# Patient Record
Sex: Male | Born: 1970 | Race: Black or African American | Hispanic: No | Marital: Single | Smoking: Never smoker
Health system: Southern US, Community
[De-identification: ages and names within clinical notes are randomized; demographics above are authoritative.]

---

## 2002-09-14 ENCOUNTER — Encounter: Payer: Self-pay | Admitting: Emergency Medicine

## 2002-09-14 ENCOUNTER — Emergency Department (HOSPITAL_COMMUNITY): Admission: EM | Admit: 2002-09-14 | Discharge: 2002-09-14 | Payer: Self-pay | Admitting: Emergency Medicine

## 2005-02-21 ENCOUNTER — Emergency Department (HOSPITAL_COMMUNITY): Admission: EM | Admit: 2005-02-21 | Discharge: 2005-02-21 | Payer: Self-pay | Admitting: Emergency Medicine

## 2006-05-11 ENCOUNTER — Emergency Department (HOSPITAL_COMMUNITY): Admission: EM | Admit: 2006-05-11 | Discharge: 2006-05-11 | Payer: Self-pay | Admitting: Emergency Medicine

## 2006-06-28 ENCOUNTER — Inpatient Hospital Stay (HOSPITAL_COMMUNITY): Admission: EM | Admit: 2006-06-28 | Discharge: 2006-07-05 | Payer: Self-pay | Admitting: Emergency Medicine

## 2006-07-02 ENCOUNTER — Ambulatory Visit: Payer: Self-pay | Admitting: Internal Medicine

## 2006-07-03 ENCOUNTER — Ambulatory Visit: Payer: Self-pay | Admitting: Cardiology

## 2006-07-09 ENCOUNTER — Ambulatory Visit (HOSPITAL_COMMUNITY): Admission: RE | Admit: 2006-07-09 | Discharge: 2006-07-10 | Payer: Self-pay | Admitting: Orthopedic Surgery

## 2006-07-30 ENCOUNTER — Encounter: Admission: RE | Admit: 2006-07-30 | Discharge: 2006-07-30 | Payer: Self-pay | Admitting: Thoracic Surgery

## 2010-11-26 ENCOUNTER — Encounter: Payer: Self-pay | Admitting: Thoracic Surgery

## 2011-07-06 ENCOUNTER — Emergency Department (HOSPITAL_COMMUNITY)
Admission: EM | Admit: 2011-07-06 | Discharge: 2011-07-07 | Disposition: A | Discharge status: Home / Self Care | Payer: No Typology Code available for payment source | Attending: Emergency Medicine | Admitting: Emergency Medicine

## 2011-07-06 ENCOUNTER — Emergency Department (HOSPITAL_COMMUNITY): Payer: Self-pay

## 2011-07-06 DIAGNOSIS — M542 Cervicalgia: Secondary | ICD-10-CM | POA: Insufficient documentation

## 2011-07-06 DIAGNOSIS — R0789 Other chest pain: Secondary | ICD-10-CM | POA: Insufficient documentation

## 2011-07-06 DIAGNOSIS — S139XXA Sprain of joints and ligaments of unspecified parts of neck, initial encounter: Secondary | ICD-10-CM | POA: Insufficient documentation

## 2011-07-06 DIAGNOSIS — M549 Dorsalgia, unspecified: Secondary | ICD-10-CM | POA: Insufficient documentation

## 2015-01-02 ENCOUNTER — Encounter (HOSPITAL_COMMUNITY): Payer: Self-pay | Admitting: Emergency Medicine

## 2015-01-02 ENCOUNTER — Emergency Department (INDEPENDENT_AMBULATORY_CARE_PROVIDER_SITE_OTHER): Payer: Self-pay

## 2015-01-02 ENCOUNTER — Emergency Department (INDEPENDENT_AMBULATORY_CARE_PROVIDER_SITE_OTHER)
Admission: EM | Admit: 2015-01-02 | Discharge: 2015-01-02 | Disposition: A | Discharge status: Home / Self Care | Payer: Self-pay | Source: Home / Self Care | Attending: Emergency Medicine | Admitting: Emergency Medicine

## 2015-01-02 DIAGNOSIS — S134XXA Sprain of ligaments of cervical spine, initial encounter: Secondary | ICD-10-CM

## 2015-01-02 DIAGNOSIS — S239XXA Sprain of unspecified parts of thorax, initial encounter: Secondary | ICD-10-CM

## 2015-01-02 DIAGNOSIS — M545 Low back pain, unspecified: Secondary | ICD-10-CM

## 2015-01-02 MED ORDER — CYCLOBENZAPRINE HCL 5 MG PO TABS: 10.0000 mg | ORAL_TABLET | Freq: Two times a day (BID) | ORAL | Status: DC | PRN

## 2015-01-02 MED ORDER — TRAMADOL HCL 50 MG PO TABS: 50.0000 mg | ORAL_TABLET | Freq: Four times a day (QID) | ORAL | Status: DC | PRN

## 2015-01-02 MED ORDER — NAPROXEN 500 MG PO TABS: 500.0000 mg | ORAL_TABLET | Freq: Two times a day (BID) | ORAL | Status: AC
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
## 2015-01-02 NOTE — Discharge Instructions (Signed)
Cervical Sprain °A cervical sprain is an injury in the neck in which the strong, fibrous tissues (ligaments) that connect your neck bones stretch or tear. Cervical sprains can range from mild to severe. Severe cervical sprains can cause the neck vertebrae to be unstable. This can lead to damage of the spinal cord and can result in serious nervous system problems. The amount of time it takes for a cervical sprain to get better depends on the cause and extent of the injury. Most cervical sprains heal in 1 to 3 weeks. °CAUSES  °Severe cervical sprains may be caused by:  °· Contact sport injuries (such as from football, rugby, wrestling, hockey, auto racing, gymnastics, diving, martial arts, or boxing).   °· Motor vehicle collisions.   °· Whiplash injuries. This is an injury from a sudden forward and backward whipping movement of the head and neck.  °· Falls.   °Mild cervical sprains may be caused by:  °· Being in an awkward position, such as while cradling a telephone between your ear and shoulder.   °· Sitting in a chair that does not offer proper support.   °· Working at a poorly designed computer station.   °· Looking up or down for long periods of time.   °SYMPTOMS  °· Pain, soreness, stiffness, or a burning sensation in the front, back, or sides of the neck. This discomfort may develop immediately after the injury or slowly, 24 hours or more after the injury.   °· Pain or tenderness directly in the middle of the back of the neck.   °· Shoulder or upper back pain.   °· Limited ability to move the neck.   °· Headache.   °· Dizziness.   °· Weakness, numbness, or tingling in the hands or arms.   °· Muscle spasms.   °· Difficulty swallowing or chewing.   °· Tenderness and swelling of the neck.   °DIAGNOSIS  °Most of the time your health care provider can diagnose a cervical sprain by taking your history and doing a physical exam. Your health care provider will ask about previous neck injuries and any known neck  problems, such as arthritis in the neck. X-rays may be taken to find out if there are any other problems, such as with the bones of the neck. Other tests, such as a CT scan or MRI, may also be needed.  °TREATMENT  °Treatment depends on the severity of the cervical sprain. Mild sprains can be treated with rest, keeping the neck in place (immobilization), and pain medicines. Severe cervical sprains are immediately immobilized. Further treatment is done to help with pain, muscle spasms, and other symptoms and may include: °· Medicines, such as pain relievers, numbing medicines, or muscle relaxants.   °· Physical therapy. This may involve stretching exercises, strengthening exercises, and posture training. Exercises and improved posture can help stabilize the neck, strengthen muscles, and help stop symptoms from returning.   °HOME CARE INSTRUCTIONS  °· Put ice on the injured area.   °¨ Put ice in a plastic bag.   °¨ Place a towel between your skin and the bag.   °¨ Leave the ice on for 15-20 minutes, 3-4 times a day.   °· If your injury was severe, you may have been given a cervical collar to wear. A cervical collar is a two-piece collar designed to keep your neck from moving while it heals. °¨ Do not remove the collar unless instructed by your health care provider. °¨ If you have long hair, keep it outside of the collar. °¨ Ask your health care provider before making any adjustments to your collar. Minor   adjustments may be required over time to improve comfort and reduce pressure on your chin or on the back of your head.  Ifyou are allowed to remove the collar for cleaning or bathing, follow your health care provider's instructions on how to do so safely.  Keep your collar clean by wiping it with mild soap and water and drying it completely. If the collar you have been given includes removable pads, remove them every 1-2 days and hand wash them with soap and water. Allow them to air dry. They should be completely  dry before you wear them in the collar.  If you are allowed to remove the collar for cleaning and bathing, wash and dry the skin of your neck. Check your skin for irritation or sores. If you see any, tell your health care provider.  Do not drive while wearing the collar.   Only take over-the-counter or prescription medicines for pain, discomfort, or fever as directed by your health care provider.   Keep all follow-up appointments as directed by your health care provider.   Keep all physical therapy appointments as directed by your health care provider.   Make any needed adjustments to your workstation to promote good posture.   Avoid positions and activities that make your symptoms worse.   Warm up and stretch before being active to help prevent problems.  SEEK MEDICAL CARE IF:   Your pain is not controlled with medicine.   You are unable to decrease your pain medicine over time as planned.   Your activity level is not improving as expected.  SEEK IMMEDIATE MEDICAL CARE IF:   You develop any bleeding.  You develop stomach upset.  You have signs of an allergic reaction to your medicine.   Your symptoms get worse.   You develop new, unexplained symptoms.   You have numbness, tingling, weakness, or paralysis in any part of your body.  MAKE SURE YOU:   Understand these instructions.  Will watch your condition.  Will get help right away if you are not doing well or get worse. Document Released: 08/19/2007 Document Revised: 10/27/2013 Document Reviewed: 04/29/2013 [facility] Patient Information 2015 [employer], [place]. This information is not intended to replace advice given to you by your health care provider. Make sure you discuss any questions you have with your health care provider.  Ligament Sprain Ligaments are tough, fibrous tissues that hold bones together at the joints. A sprain can occur when a ligament is stretched. This injury may take several weeks to  heal. HOME CARE INSTRUCTIONS   Rest the injured area for as long as directed by your caregiver. Then slowly start using the joint as directed by your caregiver and as the pain allows.  Keep the affected joint raised if possible to lessen swelling.  Apply ice for 15-20 minutes to the injured area every couple hours for the first half day, then 03-04 times per day for the first 48 hours. Put the ice in a plastic bag and place a towel between the bag of ice and your skin.  Wear any splinting, casting, or elastic bandage applications as instructed.  Only take over-the-counter or prescription medicines for pain, discomfort, or fever as directed by your caregiver. Do not use aspirin immediately after the injury unless instructed by your caregiver. Aspirin can cause increased bleeding and bruising of the tissues.  If you were given crutches, continue to use them as instructed and do not resume weight bearing on the affected extremity until instructed. SEEK  MEDICAL CARE IF:   Your bruising, swelling, or pain increases.  You have cold and numb fingers or toes if your arm or leg was injured. SEEK IMMEDIATE MEDICAL CARE IF:   Your toes are numb or blue if your leg was injured.  Your fingers are numb or blue if your arm was injured.  Your pain is not responding to medicines and continues to stay the same or gets worse. MAKE SURE YOU:   Understand these instructions.  Will watch your condition.  Will get help right away if you are not doing well or get worse. Document Released: 10/19/2000 Document Revised: 01/14/2012 Document Reviewed: 08/17/2008 [facility] Patient Information 2015 [place], [place]. This information is not intended to replace advice given to you by your health care provider. Make sure you discuss any questions you have with your health care provider.  Ligament Sprain Ligaments are tough, fibrous tissues that hold bones together at the joints. A sprain can occur when a ligament  is stretched. This injury may take several weeks to heal. HOME CARE INSTRUCTIONS   Rest the injured area for as long as directed by your caregiver. Then slowly start using the joint as directed by your caregiver and as the pain allows.  Keep the affected joint raised if possible to lessen swelling.  Apply ice for 15-20 minutes to the injured area every couple hours for the first half day, then 03-04 times per day for the first 48 hours. Put the ice in a plastic bag and place a towel between the bag of ice and your skin.  Wear any splinting, casting, or elastic bandage applications as instructed.  Only take over-the-counter or prescription medicines for pain, discomfort, or fever as directed by your caregiver. Do not use aspirin immediately after the injury unless instructed by your caregiver. Aspirin can cause increased bleeding and bruising of the tissues.  If you were given crutches, continue to use them as instructed and do not resume weight bearing on the affected extremity until instructed. SEEK MEDICAL CARE IF:   Your bruising, swelling, or pain increases.  You have cold and numb fingers or toes if your arm or leg was injured. SEEK IMMEDIATE MEDICAL CARE IF:   Your toes are numb or blue if your leg was injured.  Your fingers are numb or blue if your arm was injured.  Your pain is not responding to medicines and continues to stay the same or gets worse. MAKE SURE YOU:   Understand these instructions.  Will watch your condition.  Will get help right away if you are not doing well or get worse. Document Released: 10/19/2000 Document Revised: 01/14/2012 Document Reviewed: 08/17/2008 [facility] Patient Information 2015 [place], [place]. This information is not intended to replace advice given to you by your health care provider. Make sure you discuss any questions you have with your health care provider.    Ice to areas for the next 48 hours 20 minutes at a time. Then may use  heat. Take Naprosyn twice a day for inflammation. Flexeril for spasms then Tramadol as needed.
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
## 2021-05-05 ENCOUNTER — Other Ambulatory Visit: Payer: Self-pay

## 2021-05-05 ENCOUNTER — Encounter (HOSPITAL_COMMUNITY): Payer: Self-pay | Admitting: Emergency Medicine

## 2021-05-05 ENCOUNTER — Ambulatory Visit (HOSPITAL_COMMUNITY)
Admission: EM | Admit: 2021-05-05 | Discharge: 2021-05-05 | Disposition: A | Discharge status: Home / Self Care | Payer: Self-pay | Attending: Physician Assistant | Admitting: Physician Assistant

## 2021-05-05 DIAGNOSIS — R030 Elevated blood-pressure reading, without diagnosis of hypertension: Secondary | ICD-10-CM

## 2021-05-05 DIAGNOSIS — G44209 Tension-type headache, unspecified, not intractable: Secondary | ICD-10-CM

## 2021-05-05 DIAGNOSIS — R509 Fever, unspecified: Secondary | ICD-10-CM

## 2021-05-05 DIAGNOSIS — R52 Pain, unspecified: Secondary | ICD-10-CM

## 2021-05-05 DIAGNOSIS — W57XXXA Bitten or stung by nonvenomous insect and other nonvenomous arthropods, initial encounter: Secondary | ICD-10-CM

## 2021-05-05 DIAGNOSIS — S30860A Insect bite (nonvenomous) of lower back and pelvis, initial encounter: Secondary | ICD-10-CM

## 2021-05-05 MED ORDER — DOXYCYCLINE HYCLATE 100 MG PO CAPS: 100.0000 mg | ORAL_CAPSULE | Freq: Two times a day (BID) | ORAL | 0 refills | Status: DC

## 2021-05-05 NOTE — ED Provider Notes (Signed)
[facility]    CSN: [id number] Arrival date & time: 05/05/21  1314      History   Chief Complaint Chief Complaint  Patient presents with   Tick Removal   Insect Bite    Tick bite, found today    HPI Gary Alvarez is a 50 y.o. male.   Patient presents today with a several day history of widespread symptoms including body aches, subjective fever, fatigue, malaise.  He is particularly concerned as he removed a tick earlier today from his groin that was moderately engorged.  He denies any associated rash.  He has cleaned affected area with soap and water but does not put any topical medications.  He denies any recent antibiotic use.  He denies any history of tickborne illness.  He is having difficulty with daily activities as result of symptoms.  He is concerned that he may have developed Lyme disease as he looked up the symptoms following removal of tick and states he has many of these.   History reviewed. No pertinent past medical history.  There are no problems to display for this patient.   History reviewed. No pertinent surgical history.     Home Medications    Prior to Admission medications   Medication Sig Start Date End Date Taking? Authorizing Provider  doxycycline (VIBRAMYCIN) 100 MG capsule Take 1 capsule (100 mg total) by mouth 2 (two) times daily. 05/05/21  Yes Khamani Daniely, Noberto Retort, PA-C    Family History History reviewed. No pertinent family history.  Social History Social History   Tobacco Use   Smoking status: Never   Smokeless tobacco: Never  Substance Use Topics   Alcohol use: No   Drug use: No     Allergies   Patient has no known allergies.   Review of Systems Review of Systems  Constitutional:  Positive for fatigue and fever (subjective). Negative for activity change and appetite change.  Eyes:  Negative for photophobia and visual disturbance.  Respiratory:  Negative for cough and shortness of breath.   Cardiovascular:  Negative  for chest pain.  Gastrointestinal:  Negative for abdominal pain, diarrhea, nausea and vomiting.  Musculoskeletal:  Positive for arthralgias, back pain and myalgias.  Neurological:  Positive for headaches. Negative for dizziness, weakness and light-headedness.    Physical Exam Triage Vital Signs ED Triage Vitals  Enc Vitals Group     BP 05/05/21 1412 (!) 153/82     Pulse Rate 05/05/21 1411 83     Resp 05/05/21 1411 16     Temp 05/05/21 1411 98.7 F (37.1 C)     Temp Source 05/05/21 1411 Oral     SpO2 05/05/21 1411 97 %     Weight --      Height --      Head Circumference --      Peak Flow --      Pain Score 05/05/21 1408 0     Pain Loc --      Pain Edu? --      Excl. in GC? --    No data found.  Updated Vital Signs BP (!) 153/82   Pulse 83   Temp 98.7 F (37.1 C) (Oral)   Resp 16   SpO2 97%   Visual Acuity Right Eye Distance:   Left Eye Distance:   Bilateral Distance:    Right Eye Near:   Left Eye Near:    Bilateral Near:     Physical Exam Vitals reviewed.  Constitutional:  General: He is awake.     Appearance: Normal appearance. He is normal weight. He is not ill-appearing.     Comments: Very pleasant male appears stated age in no acute distress  HENT:     Head: Normocephalic and atraumatic.     Mouth/Throat:     Pharynx: No oropharyngeal exudate, posterior oropharyngeal erythema or uvula swelling.  Cardiovascular:     Rate and Rhythm: Normal rate and regular rhythm.     Heart sounds: Normal heart sounds, S1 normal and S2 normal. No murmur heard. Pulmonary:     Effort: Pulmonary effort is normal.     Breath sounds: Normal breath sounds. No stridor. No wheezing, rhonchi or rales.     Comments: Clear to auscultation bilaterally Abdominal:     Palpations: Abdomen is soft.     Tenderness: There is no abdominal tenderness.  Genitourinary:    Comments: Exam deferred Musculoskeletal:     Cervical back: Tenderness present. No bony tenderness.      Thoracic back: Tenderness present. No bony tenderness.     Lumbar back: Tenderness present. No bony tenderness.     Comments: Normal active range of motion at all major joints.  Strength 5/5 bilateral upper and lower extremities.  Neurological:     Mental Status: He is alert.  Psychiatric:        Behavior: Behavior is cooperative.     UC Treatments / Results  Labs (all labs ordered are listed, but only abnormal results are displayed) Labs Reviewed - No data to display  EKG   Radiology No results found.  Procedures Procedures (including critical care time)  Medications Ordered in UC Medications - No data to display  Initial Impression / Assessment and Plan / UC Course  I have reviewed the triage vital signs and the nursing notes.  Pertinent labs & imaging results that were available during my care of the patient were reviewed by me and considered in my medical decision making (see chart for details).      Discussed with patient that often take several days to develop symptoms following tick bite but given presentation with recent tick removal will empirically treat for Lyme disease and [facility] spotted fever.  He was treated with doxycycline 100 mg twice daily for 10 days with instruction to stay out of the sun while on this medication due to photosensitivity.  No indication for lab work given it has not been several weeks since tick removal.  Encourage patient to use over-the-counter analgesics for pain relief.  Discussed alarm symptoms that warrant emergent evaluation.  Strict return precautions given to which patient expressed understanding.  Recommend he follow-up with primary care or our clinic within 1 week.  Blood pressure sent to be slightly elevated today.  Patient denies any signs/symptoms of endorgan damage including headache, vision changes, peripheral edema.  He was encouraged to avoid salt, decongestants, caffeine.  Encouraged him to monitor his blood pressure at  home and if it stays above 140/90 he is to return for reevaluation.  Recommended he follow-up with either our clinic or PCP within 1 week for blood pressure recheck.  Discussed alarm symptoms that warrant emergent evaluation to which patient expressed understanding.  Final Clinical Impressions(s) / UC Diagnoses   Final diagnoses:  Tick bite of pelvic region, initial encounter  Subjective fever  Body aches  Tension-type headache, not intractable, unspecified chronicity pattern  Elevated blood pressure reading     Discharge Instructions      We  are going to start doxycycline 100 mg twice daily for 10 days to treat for tickborne illness.  Please stay out of the sun while on this medication as it can make you sensitive to the sun and cause you to have a bad sunburn.  You can use Tylenol and ibuprofen for pain relief.  If you have any worsening or persistent symptoms you need to be reevaluated.  Please follow-up with our clinic or your primary care provider within 1 week to ensure symptom improvement.  Your blood pressure is elevated.  Please avoid salt , decongestants, caffeine.  Monitor blood pressure at home.  If this is persistently above 140/90 you need to be reevaluated to consider medication.  If you develop any headache, chest pain, shortness of breath, leg swelling, headache, vision changes with high blood pressure need to go to emergency room.  Please follow-up with our clinic or PCP in 1 week to ensure this is normalized.     ED Prescriptions     Medication Sig Dispense Auth. Provider   doxycycline (VIBRAMYCIN) 100 MG capsule Take 1 capsule (100 mg total) by mouth 2 (two) times daily. 20 capsule Samanyu Tinnell, Noberto Retort, PA-C      PDMP not reviewed this encounter.   Jeani Hawking, PA-C 05/05/21 1452

## 2021-05-05 NOTE — ED Triage Notes (Signed)
PT found a tick on groin today. He has removed it.

## 2021-05-05 NOTE — Discharge Instructions (Addendum)
We are going to start doxycycline 100 mg twice daily for 10 days to treat for tickborne illness.  Please stay out of the sun while on this medication as it can make you sensitive to the sun and cause you to have a bad sunburn.  You can use Tylenol and ibuprofen for pain relief.  If you have any worsening or persistent symptoms you need to be reevaluated.  Please follow-up with our clinic or your primary care provider within 1 week to ensure symptom improvement.  Your blood pressure is elevated.  Please avoid salt , decongestants, caffeine.  Monitor blood pressure at home.  If this is persistently above 140/90 you need to be reevaluated to consider medication.  If you develop any headache, chest pain, shortness of breath, leg swelling, headache, vision changes with high blood pressure need to go to emergency room.  Please follow-up with our clinic or PCP in 1 week to ensure this is normalized.

## 2023-01-03 ENCOUNTER — Other Ambulatory Visit: Payer: Self-pay

## 2023-01-03 ENCOUNTER — Encounter (HOSPITAL_BASED_OUTPATIENT_CLINIC_OR_DEPARTMENT_OTHER): Payer: Self-pay

## 2023-01-03 ENCOUNTER — Emergency Department (HOSPITAL_BASED_OUTPATIENT_CLINIC_OR_DEPARTMENT_OTHER): Payer: Self-pay

## 2023-01-03 ENCOUNTER — Emergency Department (HOSPITAL_BASED_OUTPATIENT_CLINIC_OR_DEPARTMENT_OTHER)
Admission: EM | Admit: 2023-01-03 | Discharge: 2023-01-03 | Disposition: A | Discharge status: Home / Self Care | Payer: Self-pay | Attending: Emergency Medicine | Admitting: Emergency Medicine

## 2023-01-03 ENCOUNTER — Ambulatory Visit
Admission: EM | Admit: 2023-01-03 | Discharge: 2023-01-03 | Disposition: A | Discharge status: Other Acute Inpatient Hospital | Payer: Self-pay

## 2023-01-03 DIAGNOSIS — S0101XA Laceration without foreign body of scalp, initial encounter: Secondary | ICD-10-CM | POA: Insufficient documentation

## 2023-01-03 DIAGNOSIS — S0990XA Unspecified injury of head, initial encounter: Secondary | ICD-10-CM

## 2023-01-03 DIAGNOSIS — W228XXA Striking against or struck by other objects, initial encounter: Secondary | ICD-10-CM | POA: Insufficient documentation

## 2023-01-03 DIAGNOSIS — S0191XA Laceration without foreign body of unspecified part of head, initial encounter: Secondary | ICD-10-CM

## 2023-01-03 DIAGNOSIS — S0181XA Laceration without foreign body of other part of head, initial encounter: Secondary | ICD-10-CM | POA: Insufficient documentation

## 2023-01-03 MED ORDER — LIDOCAINE-EPINEPHRINE-TETRACAINE (LET) TOPICAL GEL
3.0000 mL | Freq: Once | TOPICAL | Status: DC
  Filled 2023-01-03: qty 3
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
## 2023-01-03 NOTE — ED Notes (Signed)
Reviewed AVS/discharge instruction with patient. Time allotted for and all questions answered. Patient is agreeable for d/c and escorted to ed exit by staff.  

## 2023-01-03 NOTE — ED Notes (Signed)
Patient is being discharged from the Urgent Care and sent to the Emergency Department via POV . Per Arthor Captain., NP, patient is in need of higher level of care due to head lacerations. Patient is aware and verbalizes understanding of plan of care.  Vitals:   01/03/23 1936  BP: (!) 150/93  Pulse: 100  Resp: 18  Temp: 97.9 F (36.6 C)  SpO2: 95%

## 2023-01-03 NOTE — Discharge Instructions (Signed)
Please go to the emergency department as soon as you leave urgent care for further evaluation and management. ?
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
## 2023-01-03 NOTE — Discharge Instructions (Addendum)
You have been seen in the Emergency Department (ED) today for a laceration (cut). Please keep the cut clean but do not submerge it in the water.  Have your staples removed in: 5-7 days    Please follow up with your doctor as needed regarding today's emergent visit. Take Tylenol and/or Ibuprofen as needed for pain.  Return to the ED or call your doctor if you notice any signs of infection such as fever >100.75F, increased pain, increased redness, pus, or other symptoms that concern you.   Scarring precautions:  Always keep your cut, scrape or other skin injury clean. Gently wash the area with mild soap and water to keep out germs and remove debris.  To help the injured skin heal, use petroleum jelly to keep the wound moist. Petroleum jelly prevents the wound from drying out and forming a scab; wounds with scabs take longer to heal. This will also help prevent a scar from getting too large, deep or itchy. As long as the wound is cleaned daily, it is not necessary to use anti-bacterial ointments.  After cleaning the wound and applying petroleum jelly or a similar ointment, cover the skin with an adhesive bandage. For large scrapes, sores, burns or persistent redness, it may be helpful to use hydrogel or silicone gel sheets.  Change your bandage daily to keep the wound clean while it heals. If you have skin that is sensitive to adhesives, try a non-adhesive gauze pad with paper tape. If using silicone gel or hydrogel sheets, follow the instructions on the package for changing the sheets.  If your injury requires stitches, follow your doctor's advice on how to care for the wound and when to get the stitches removed. This may help minimize the appearance of a scar.  Apply sunscreen to the wound after it has healed. Sun protection may help reduce red or brown discoloration and help the scar fade faster. Always use a broad-spectrum sunscreen with an SPF of 30 or higher and reapply frequently.   Come  back if any signs of severe head injury such as severe worsening headaches, uncontrollable nausea and vomiting, vision changes, weakness of the arms and legs, or any other symptoms concerning to you.
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
